# Patient Record
Sex: Male | Born: 1955 | Race: White | Hispanic: No | Marital: Married | Smoking: Former smoker
Health system: Southern US, Community
[De-identification: ages and names within clinical notes are randomized; demographics above are authoritative.]

## PROBLEM LIST (undated history)

## (undated) DIAGNOSIS — E7800 Pure hypercholesterolemia, unspecified: Secondary | ICD-10-CM

## (undated) DIAGNOSIS — I639 Cerebral infarction, unspecified: Secondary | ICD-10-CM

## (undated) DIAGNOSIS — I10 Essential (primary) hypertension: Secondary | ICD-10-CM

## (undated) DIAGNOSIS — M109 Gout, unspecified: Secondary | ICD-10-CM

## (undated) HISTORY — PX: CARDIAC CATHETERIZATION: SHX172

---

## 2013-11-10 ENCOUNTER — Emergency Department: Payer: Self-pay | Admitting: Emergency Medicine

## 2013-11-10 LAB — BASIC METABOLIC PANEL
ANION GAP: 5 — AB (ref 7–16)
BUN: 24 mg/dL — ABNORMAL HIGH (ref 7–18)
CALCIUM: 9.2 mg/dL (ref 8.5–10.1)
CO2: 30 mmol/L (ref 21–32)
Chloride: 107 mmol/L (ref 98–107)
Creatinine: 1.58 mg/dL — ABNORMAL HIGH (ref 0.60–1.30)
EGFR (African American): 56 — ABNORMAL LOW
GFR CALC NON AF AMER: 48 — AB
Glucose: 163 mg/dL — ABNORMAL HIGH (ref 65–99)
Osmolality: 291 (ref 275–301)
Potassium: 4.1 mmol/L (ref 3.5–5.1)
Sodium: 142 mmol/L (ref 136–145)

## 2013-11-10 LAB — CBC WITH DIFFERENTIAL/PLATELET
Basophil #: 0.1 10*3/uL (ref 0.0–0.1)
Basophil %: 0.8 %
EOS ABS: 0.1 10*3/uL (ref 0.0–0.7)
EOS PCT: 1.1 %
HCT: 43.8 % (ref 40.0–52.0)
HGB: 14.5 g/dL (ref 13.0–18.0)
Lymphocyte #: 1.7 10*3/uL (ref 1.0–3.6)
Lymphocyte %: 17.7 %
MCH: 28 pg (ref 26.0–34.0)
MCHC: 33.2 g/dL (ref 32.0–36.0)
MCV: 84 fL (ref 80–100)
Monocyte #: 0.4 x10 3/mm (ref 0.2–1.0)
Monocyte %: 3.9 %
Neutrophil #: 7.2 10*3/uL — ABNORMAL HIGH (ref 1.4–6.5)
Neutrophil %: 76.5 %
PLATELETS: 248 10*3/uL (ref 150–440)
RBC: 5.19 10*6/uL (ref 4.40–5.90)
RDW: 13.8 % (ref 11.5–14.5)
WBC: 9.4 10*3/uL (ref 3.8–10.6)

## 2013-11-10 LAB — TROPONIN I: Troponin-I: <0.02 ng/mL

## 2013-11-10 LAB — URINALYSIS, COMPLETE
BACTERIA: NONE SEEN
BLOOD: NEGATIVE
Bilirubin,UR: NEGATIVE
GLUCOSE, UR: NEGATIVE mg/dL (ref 0–75)
Ketone: NEGATIVE
Leukocyte Esterase: NEGATIVE
NITRITE: NEGATIVE
PH: 6 (ref 4.5–8.0)
Protein: 30
RBC,UR: <1 /HPF (ref 0–5)
SQUAMOUS EPITHELIAL: NONE SEEN
Specific Gravity: 1.018 (ref 1.003–1.030)
WBC UR: 2 /HPF (ref 0–5)

## 2013-11-10 LAB — CK TOTAL AND CKMB (NOT AT ARMC)
CK, TOTAL: 125 U/L
CK-MB: 1.5 ng/mL (ref 0.5–3.6)

## 2013-11-11 DIAGNOSIS — Z8673 Personal history of transient ischemic attack (TIA), and cerebral infarction without residual deficits: Secondary | ICD-10-CM | POA: Insufficient documentation

## 2013-12-01 DIAGNOSIS — I10 Essential (primary) hypertension: Secondary | ICD-10-CM | POA: Insufficient documentation

## 2013-12-01 DIAGNOSIS — E785 Hyperlipidemia, unspecified: Secondary | ICD-10-CM | POA: Insufficient documentation

## 2013-12-17 DIAGNOSIS — I776 Arteritis, unspecified: Secondary | ICD-10-CM | POA: Insufficient documentation

## 2014-11-07 NOTE — Consult Note (Signed)
PATIENT NAMGuadlupe Strong:  Strong, [name] MR#:  [id number] DATE OF BIRTH:  July 01, 1956  DATE OF CONSULTATION:  11/11/2013  CONSULTING PHYSICIAN:  Starleen Armsawood S. Lorenia Hoston, MD  REFERRING PHYSICIAN: Dr. Sharyn CreamerMark Quale.  PRIMARY CARE PHYSICIAN: None.   CHIEF COMPLAINT: Dizziness, weakness.   HISTORY OF PRESENT ILLNESS: This is a 59 year old male without significant past medical history, who presents initially with complaints of dizziness and generalized weakness. At the time of my exam, the patient was lethargic. Most of the history was obtained from ED staff and family at bedside. The patient was working out this evening, where he started to have some dizziness, nausea, generalized weakness. Around 7:00, when he presented to the ED, the patient had CT head without contrast which did show evidence of cerebellar abnormalities suspicious for lacunar infarcts, chronic on the right, possibly acute versus subacute in the left. The patient by 8:00 as most of his symptoms improved. At around 9:00, the patient had recurrence of his symptoms with severe dizziness, and generalized weakness as well. The patient had CTA chest angiogram to rule out aortic dissection, which did not show any evidence of dissection, but did show evidence of mural thrombus. By the time of my exam around 12:30, the patient was noticed to have a significant change in mental status, where he is lethargic, has significant right side weakness as well, and has closed eye in the right eye. As well, [facility] which I discussed with Dr. Harl Favoravit over the phone who thought suspicion might be having brain stem infarction. As he is currently out of his TPA window, requested possible need for transfer for tertiary care center for possible need for interventional neurosurgery. He recommended CT head and neck, as there is a suspicion for vertebral dissection or passing a clot. Contacted [facility] stroke center. Discussed with Dr. Lorenso CourierPowers, who  will accept the transfer at this point. The patient is getting CTA head and neck at this point. The results will be called to Dr. Lorenso CourierPowers as the patient [facility]  if he needed any intervention done, it could be done as soon as possible.   PAST MEDICAL HISTORY: None.   PAST SURGICAL HISTORY: None.   SOCIAL HISTORY: No history of smoking, alcohol, or illicit drug use.   FAMILY HISTORY: Family history of CVA in the patient's father but at an older age.   REVIEW OF SYSTEMS: The patient is currently lethargic. Cannot give any reliable review of systems, confused.   ALLERGIES: No known drug allergies.   HOME MEDICATIONS:  1. Fish oil.  2. Multivitamins.   PHYSICAL EXAMINATION:  VITAL SIGNS: Temperature 96.8, pulse 56, respiratory rate 16, blood pressure 147/66, satting 97% on room air.  GENERAL: The patient is lethargic, confused in bed.  HEENT: Head atraumatic, normocephalic. Pupils: Have bilateral pupils reactive to light and conjunctivae, anicteric sclerae. Moist oral mucosa.  NECK: Supple. No thyromegaly. No JVD.  CHEST: Good air entry bilaterally. No wheezing, rales, rhonchi.  CARDIOVASCULAR: S1, S2 heard. no rubs,murmurs,gallops.  ABDOMEN: Soft, nontender, nondistended. Bowel sounds present.  EXTREMITIES: No edema. No clubbing. No cyanosis.  PSYCHIATRIC: The patient is lethargic and confused, but when he is pushed to answer, he is awake. alert x 2.  NEUROLOGIC: Examination was very difficult to perform secondary to his altered mental status. Cranial nerve exam was significant for very mild right tongue deviation but patient was able to move tongue to both side without significant deficits.  Eye examination was significant for right eye being closed as well patient was not able to move his bilateral eyes to the right direction. Motor right upper extremity weakness 3/5. Right lower extremity weakness 2 to 3/5. Left upper and lower motor 5/5 motor strength and able to  finger-to-nose test on the left side was performed and was normal and the right side was not able to perform due to right-side weakness.  SKIN: Normal skin turgor. Warm and dry.   PERTINENT LABORATORY DATA: Glucose 163, BUN 24, creatinine 1.58, sodium 142, potassium 4.1, creatinine 1.58, CO2 of 30. Troponin less than 0.02. White blood cells 9.4, hemoglobin 14.5, hematocrit 43.8, platelets 248,000. Urinalysis negative for leukocyte esterase and nitrite. ABG showing pH of 7.38, pCO2 41, pO2 of 63.   IMAGING STUDIES: CT head without contrast showing vague small areas of decreased attenuation at the left cerebral hemisphere, may reflect small lacunar infarcts of indeterminate age. No evidence of hemorrhagic transformation and small lacunar infarct at the right cerebral hemispheres. CT angio chest showing no evidence of  aortic dissection, nor leaking aneurysm. There is a mural thrombus within the descending thoracic aorta.   CTA head and neck still pending.   ASSESSMENT AND PLAN: Cerebrovascular accident. Case was discussed with both neurology on call and [facility]  Dr. Lorenso Courier. The patient appears to be having strokes, suspicion as per Dr. Harl Favor is for possible vertebral dissection or basilar clot. This is why we are obtaining the CTA head and neck. The patient accepted to be transferred to [facility] stroke cancer. The patient was given per rectum aspirin. He is being appropriately hydrated with IV fluids. At this point the patient is out of TPA window as his symptoms have been more than 4.5 hours. If it is still possible, and depends on the CTA, he might need interventional neurosurgical intervention so this is why he is being transferred at [facility]. At this point they will resume care of the patient.   TOTAL TIME: Total time of care and consult and discussion with the consult spent on this case and discussing with the family, more than 80 minutes.    ____________________________ Starleen Arms, MD dse:lt/am D: 11/11/2013 03:07:07 ET T: 11/11/2013 04:54:43 ET JOB#: [id number]  cc: Starleen Arms, MD, <Dictator> Javien Tesch Teena Irani MD ELECTRONICALLY SIGNED 11/11/2013 23:46

## 2015-11-19 IMAGING — CT CT ANGIO CHEST
2 of 6 series · 18 of 36 positions shown · IV contrast (APPLIED)
Comparison: DG CHEST 1V PORT dated 11/10/2013

CLINICAL DATA: Near syncope, dizziness, nausea, and vomiting

EXAM:
CT ANGIOGRAPHY CHEST WITH CONTRAST
TECHNIQUE: Multidetector CT imaging of the chest was performed using the
standard protocol during bolus administration of intravenous
contrast. Multiplanar CT image reconstructions and MIPs were
obtained to evaluate the vascular anatomy.
CONTRAST:  100 cc of Isovue 370 intravenously

[Series 7: arterial · axial · arterial · 0.85mm/px · z∈[-356,-84]mm · 17 of 154 slices shown]
[im 9/154  lung]
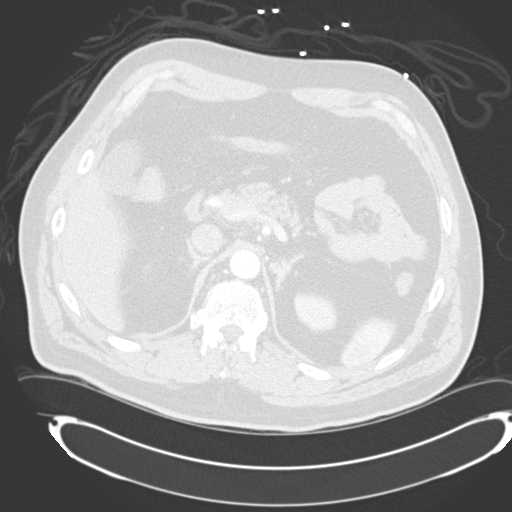
[im 17/154  mediastinal]
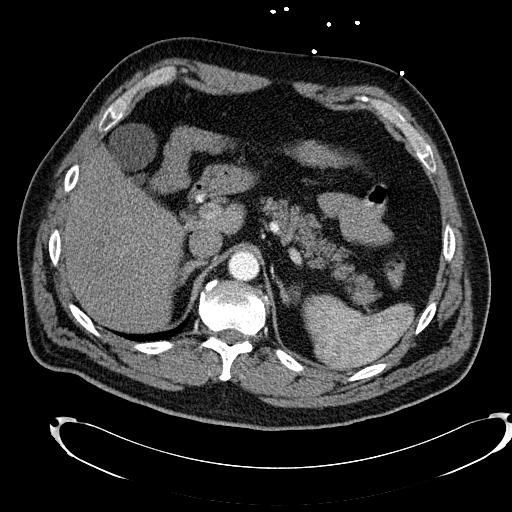
[im 25/154  lung]
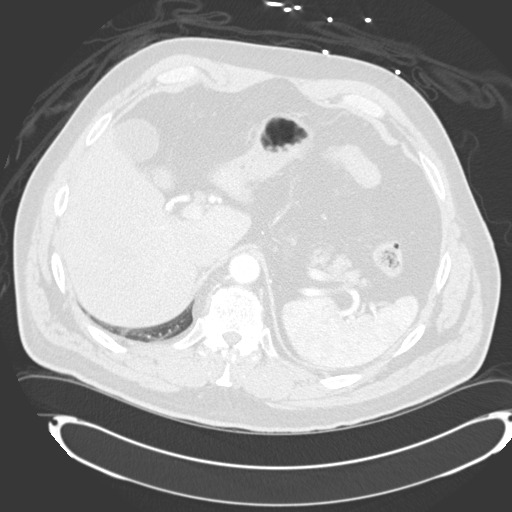
[im 33/154  mediastinal]
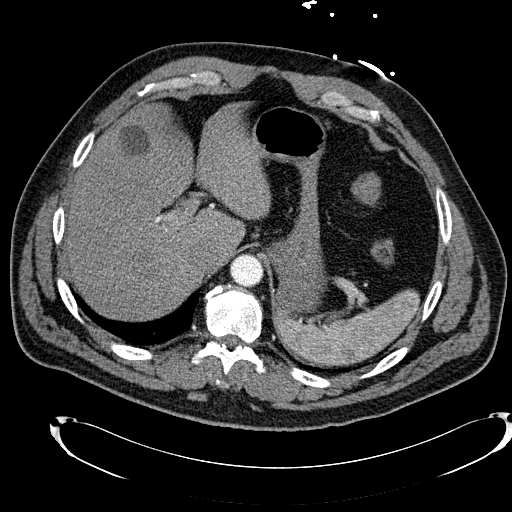
[im 41/154  lung]
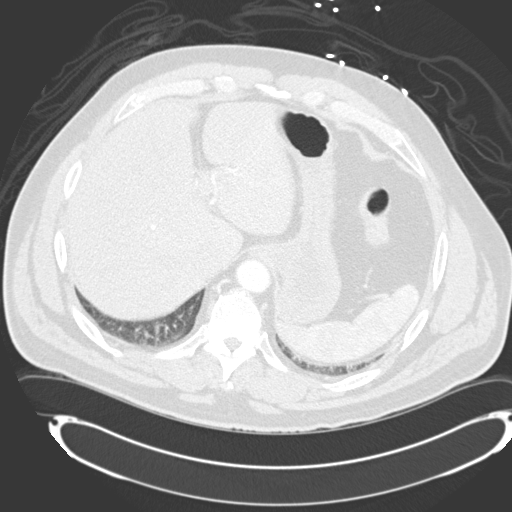
[im 49/154  mediastinal]
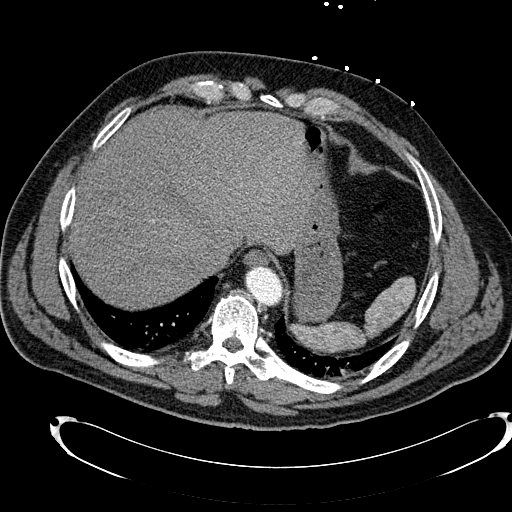
[im 57/154  lung]
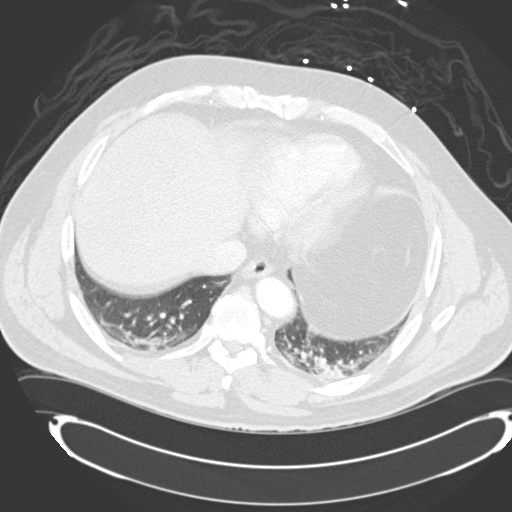
[im 65/154  mediastinal]
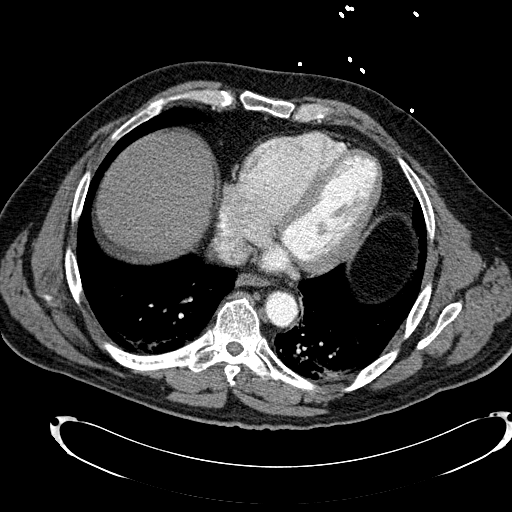
[im 81/154  lung]
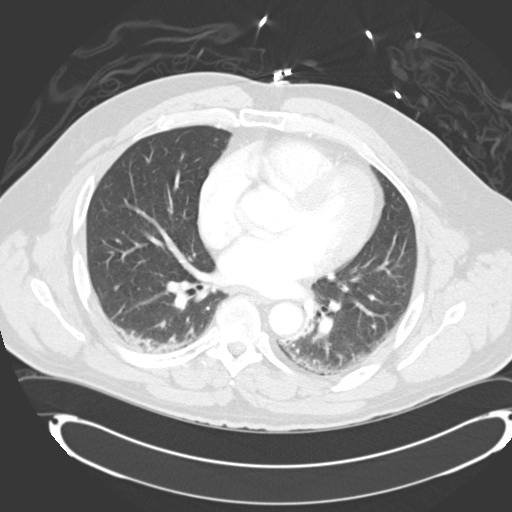
[im 89/154  mediastinal]
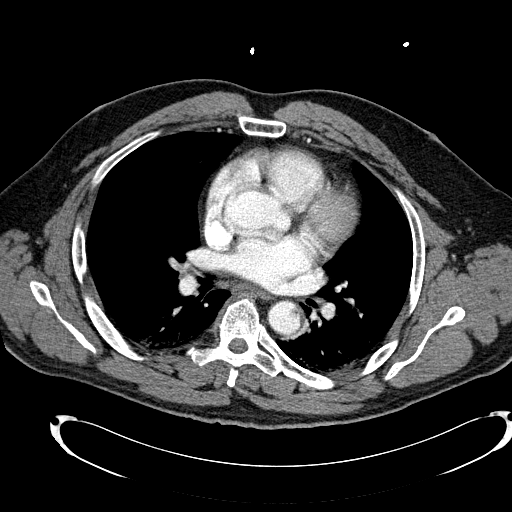
[im 97/154  lung]
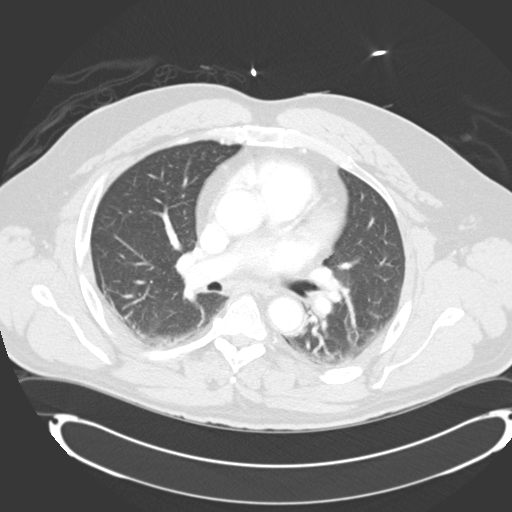
[im 105/154  mediastinal]
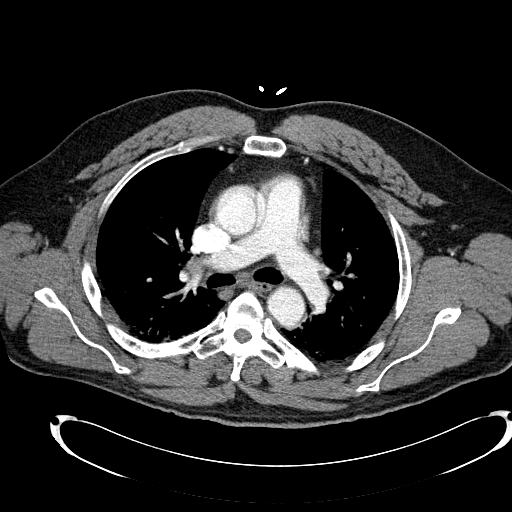
[im 113/154  lung]
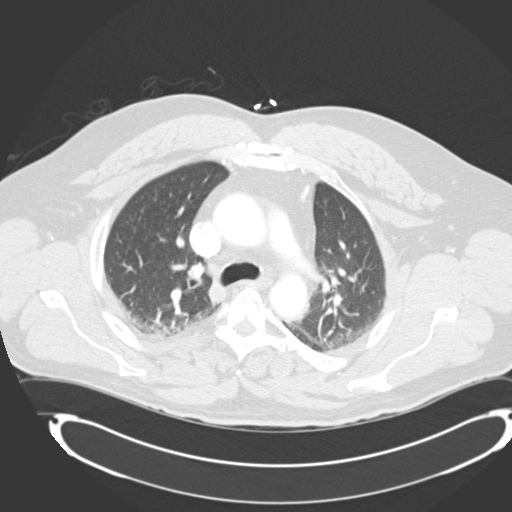
[im 121/154  mediastinal]
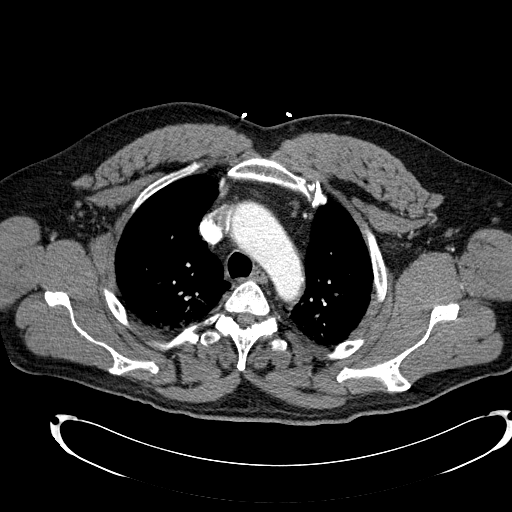
[im 129/154  lung]
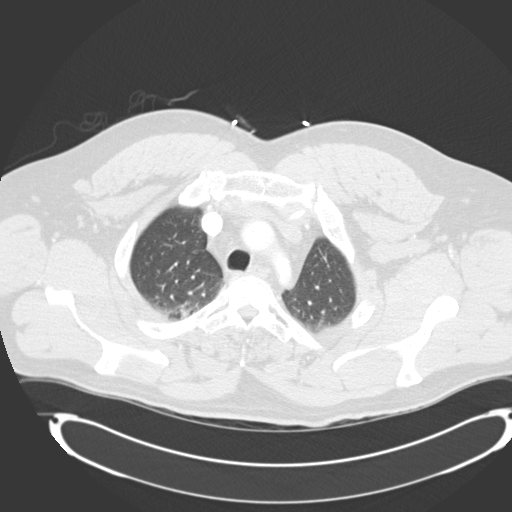
[im 137/154  mediastinal]
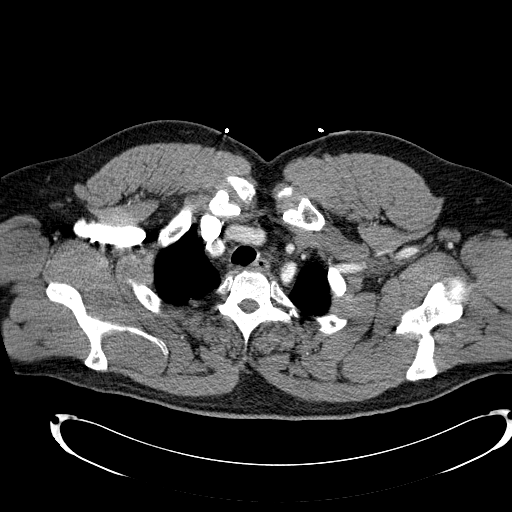
[im 145/154  lung]
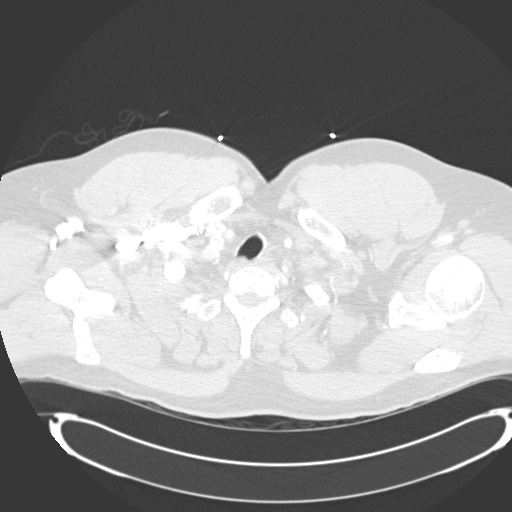

[Series 9: cor arterial mpr · coronal · arterial · 0.60mm/px · 1 of 148 slices shown]
[im 74/148  mediastinal]
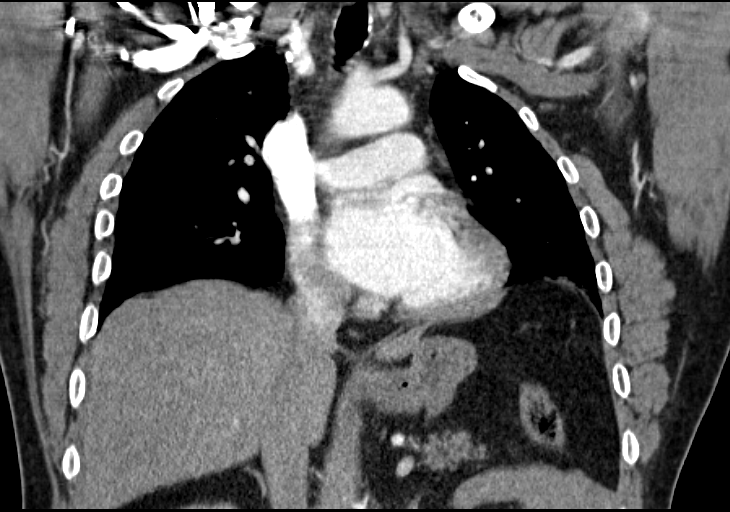

[18 of 36 positions shown; findings below may reference images not displayed]

FINDINGS: The caliber of the thoracic aorta is normal. There is no evidence of
a false lumen. A small amount of mural thrombus is present in the
descending thoracic aorta on images 63 through 69. Contrast within
the visualized portions of the pulmonary arterial tree appears
normal, but the study was not tailored for pulmonary arterial
evaluation. The retrosternal soft tissues exhibit no abnormal
densities. There is abundant retrosternal fat. The cardiac chambers
are top-normal in size. There is no mediastinal nor hilar
lymphadenopathy. There is no pleural nor pericardial effusion.

At lung window settings there is subsegmental atelectasis in the
dependent portion of both lungs. There is no alveolar infiltrate. No
abnormal pulmonary parenchymal nodules are demonstrated. There is no
pneumothorax or pneumomediastinum.

The observed portions of the bony thorax exhibit no acute
abnormalities.

Within the upper abdomen the observed portions of the liver and
spleen and adrenal glands are normal in appearance. The gallbladder
is adequately distended exhibits no calcified stones. The upper
abdominal aorta appears normal.

Review of the MIP images confirms the above findings.
IMPRESSION: 1. There is no evidence of thoracic aortic dissection nor leaking
aneurysm. There is mural thrombus within the descending thoracic
aorta.
2. There is bibasilar atelectasis in the dependent portion of both
lungs.
3. The study was not tailored for pulmonary arterial tree
evaluation. The observed portions of the pulmonary arterial tree
appear normal.

## 2015-11-19 IMAGING — CT CT HEAD WITHOUT CONTRAST
1 series · 15 of 30 positions shown, 19 images · non-contrast
Comparison: None.

REASON FOR EXAM: Headache at 8pm, now nausea, diaphoretic, feels weak.
headache improved
COMMENTS:

PROCEDURE:     CT  - CT HEAD WITHOUT CONTRAST  - November 10, 2013  [DATE]
CLINICAL DATA: Nausea, vomiting, weakness, diaphoresis and
headache.
EXAM:
CT HEAD WITHOUT CONTRAST
TECHNIQUE: Contiguous axial images were obtained from the base of the skull
through the vertex without intravenous contrast.

[Series 2: head wo · axial · 0.46mm/px · z∈[-192,-48]mm · 15 of 33 slices shown, 19 images]
[im 2/33  brain]
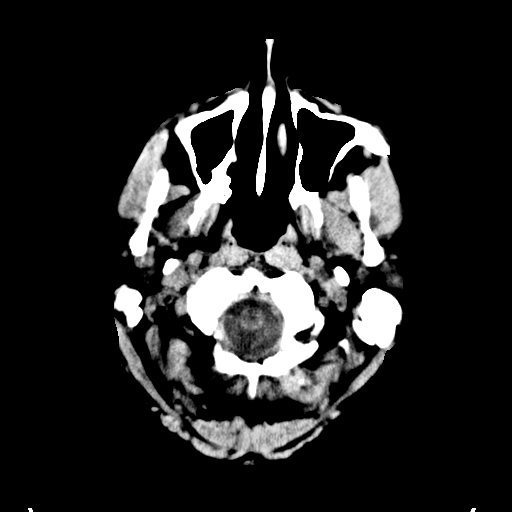
[im 2/33  bone]
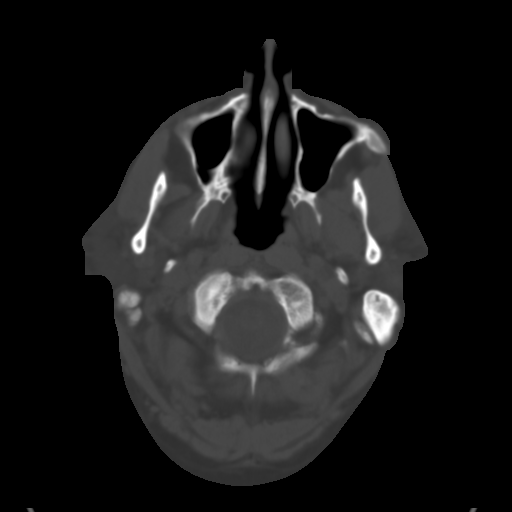
[im 4/33  brain]
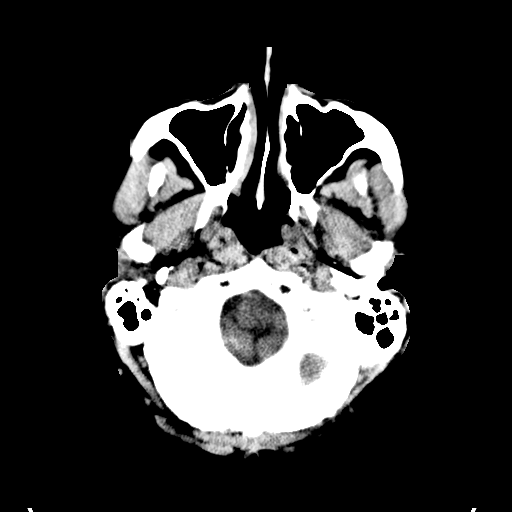
[im 6/33  brain]
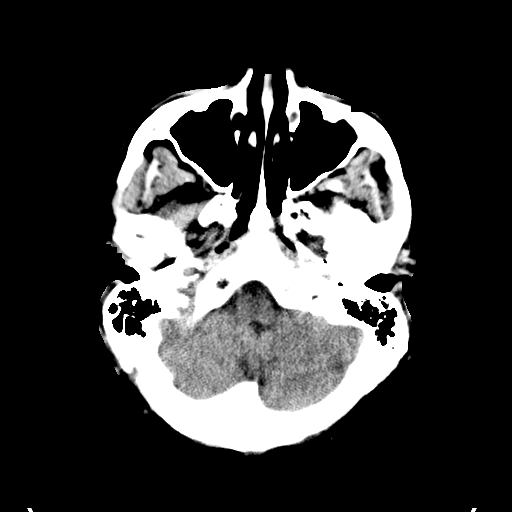
[im 8/33  brain]
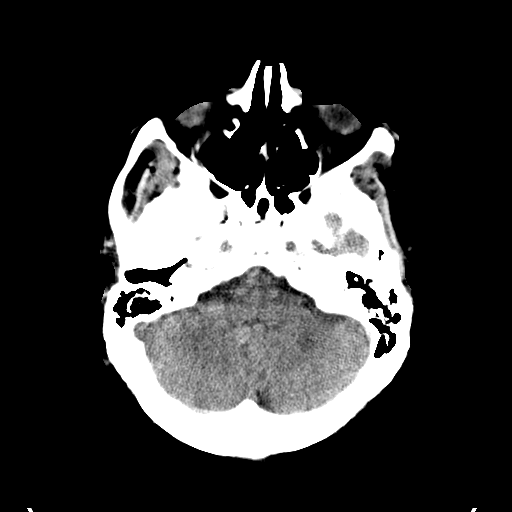
[im 10/33  brain]
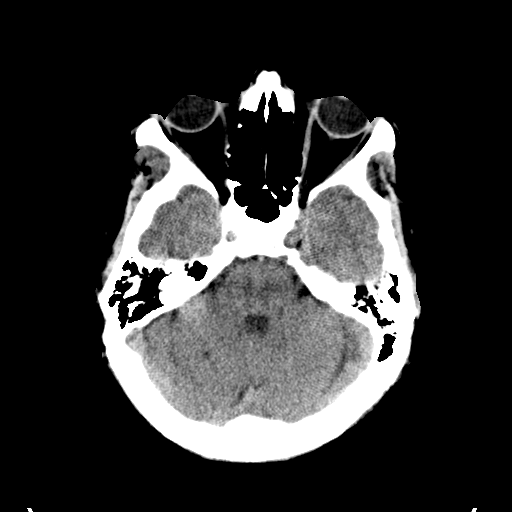
[im 10/33  bone]
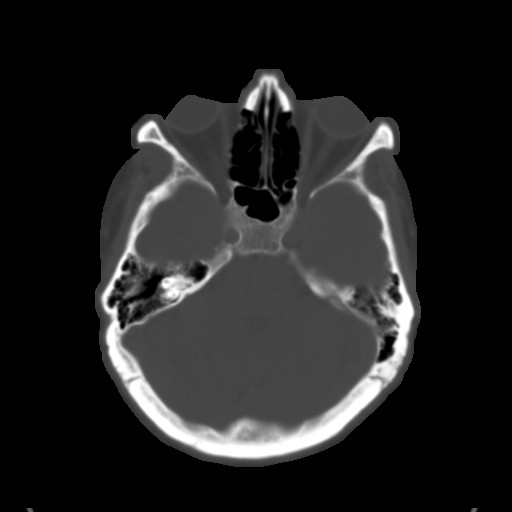
[im 13/33  brain]
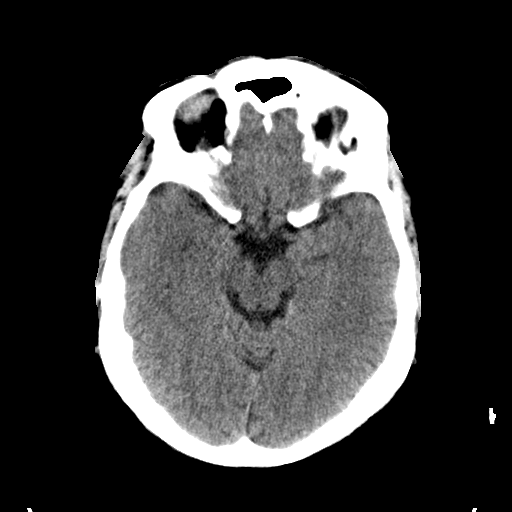
[im 15/33  brain]
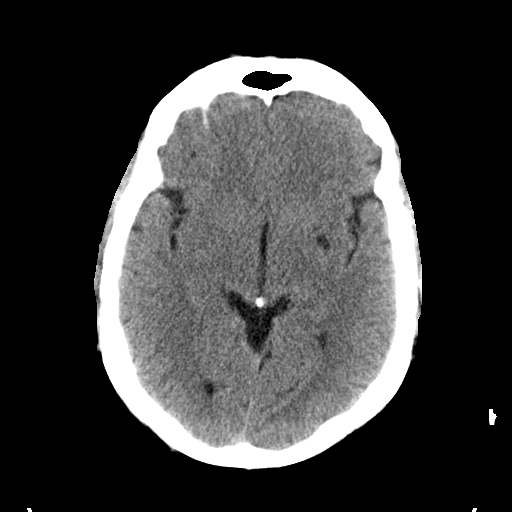
[im 17/33  brain]
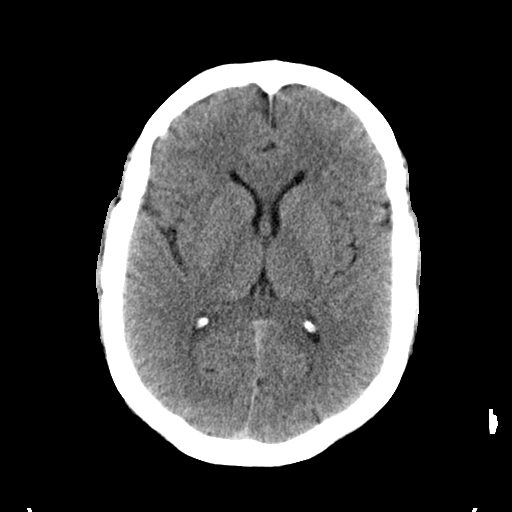
[im 18/33  brain]
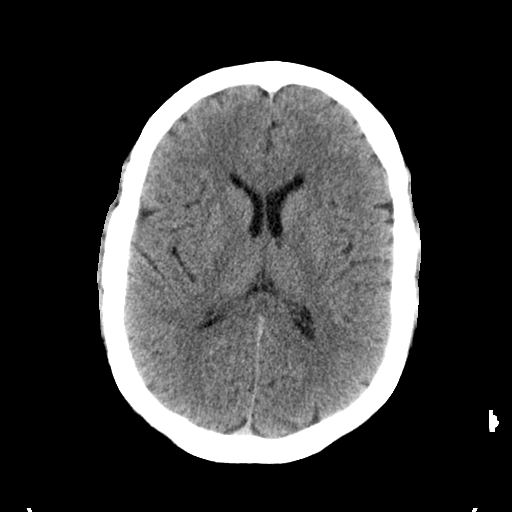
[im 18/33  bone]
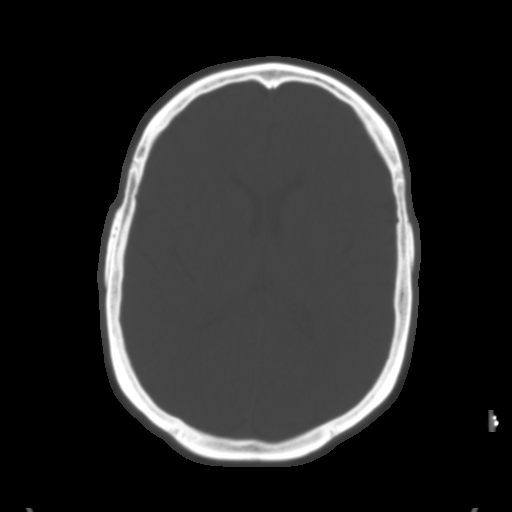
[im 20/33  brain]
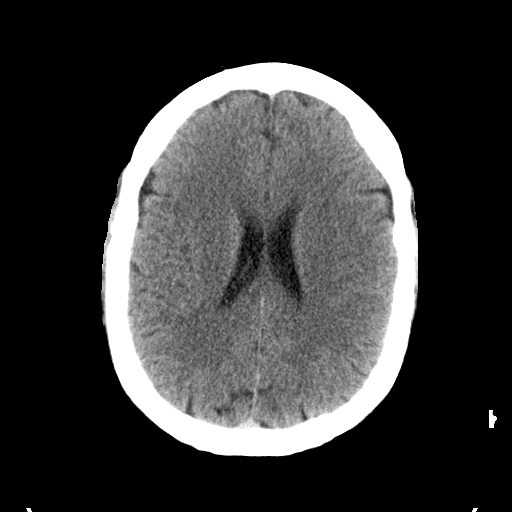
[im 23/33  brain]
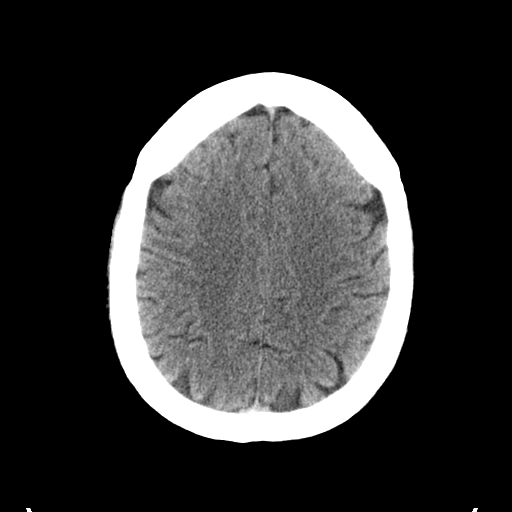
[im 25/33  brain]
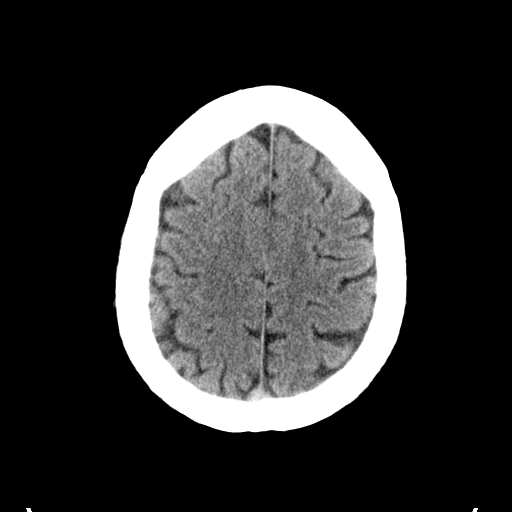
[im 27/33  brain]
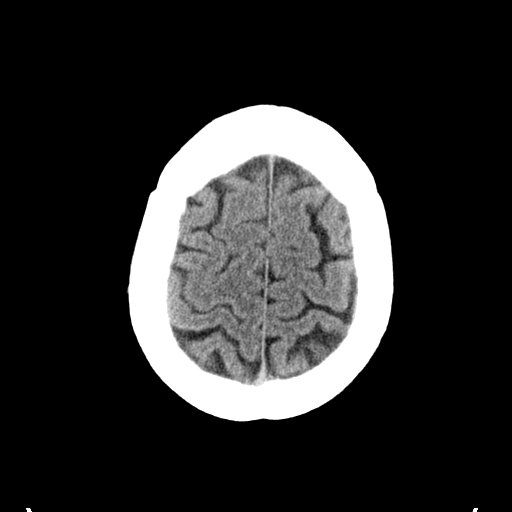
[im 27/33  bone]
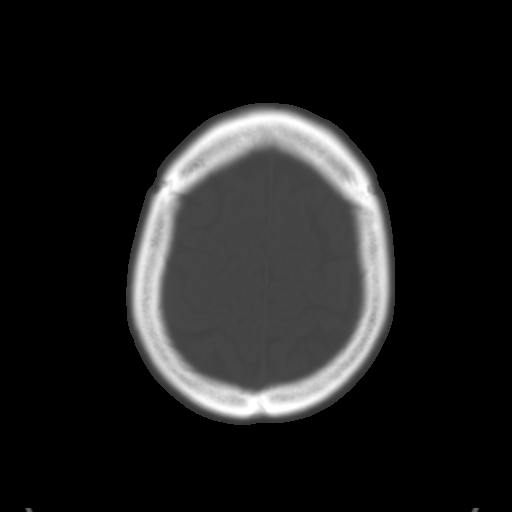
[im 29/33  brain]
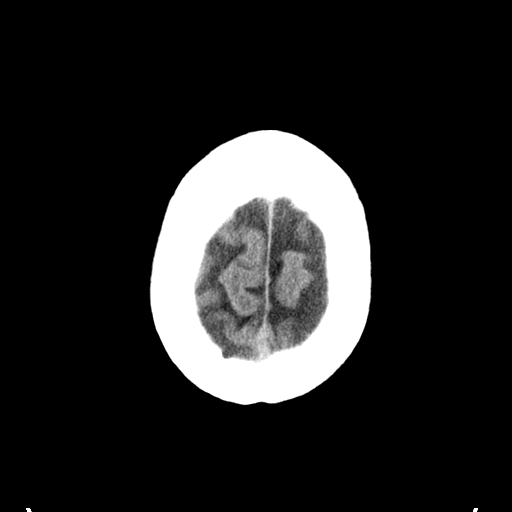
[im 31/33  brain]
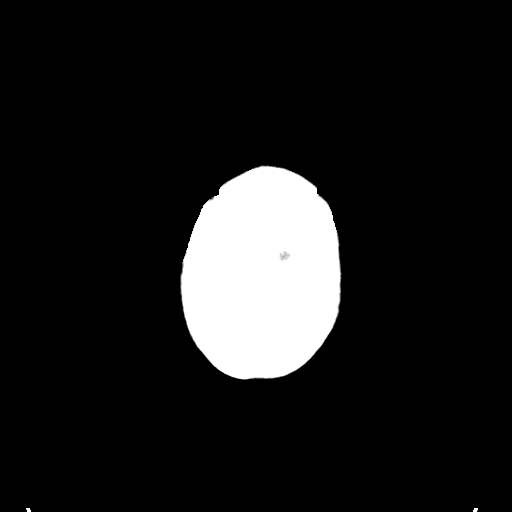

[15 of 30 positions shown; findings below may reference images not displayed]

FINDINGS: There is no evidence of acute infarction, mass lesion, or intra- or
extra-axial hemorrhage on CT.

Vague small areas of decreased attenuation at the left cerebellar
hemisphere may reflect small lacunar infarcts of indeterminate age.
A small chronic lacunar infarct is noted at the right cerebellar
hemisphere. A likely prominent Virchow-Robin space is noted at the
left inferior basal ganglia.

The brainstem and fourth ventricle are within normal limits. The
third and lateral ventricles are unremarkable in appearance. The
cerebral hemispheres are symmetric in appearance, with normal
gray-white differentiation. No mass effect or midline shift is seen.

There is no evidence of fracture; visualized osseous structures are
unremarkable in appearance. The orbits are within normal limits. The
paranasal sinuses and mastoid air cells are well-aerated. No
significant soft tissue abnormalities are seen.
IMPRESSION: 1. Vague small areas of decreased attenuation at the left cerebellar
hemisphere may reflect small lacunar infarcts of indeterminate age.
No evidence of hemorrhagic transformation.
2. Small chronic lacunar infarct at the right cerebellar hemisphere.

## 2016-04-20 ENCOUNTER — Encounter: Payer: Self-pay | Admitting: *Deleted

## 2016-04-21 ENCOUNTER — Encounter
Admission: RE | Disposition: A | Discharge status: Home / Self Care | Payer: Self-pay | Source: Ambulatory Visit | Attending: Unknown Physician Specialty

## 2016-04-21 ENCOUNTER — Ambulatory Visit: Payer: Managed Care, Other (non HMO) | Admitting: Anesthesiology

## 2016-04-21 ENCOUNTER — Encounter: Payer: Self-pay | Admitting: *Deleted

## 2016-04-21 ENCOUNTER — Ambulatory Visit
Admission: RE | Admit: 2016-04-21 | Discharge: 2016-04-21 | Disposition: A | Discharge status: Home / Self Care | Payer: Managed Care, Other (non HMO) | Source: Ambulatory Visit | Attending: Unknown Physician Specialty | Admitting: Unknown Physician Specialty

## 2016-04-21 DIAGNOSIS — Z1211 Encounter for screening for malignant neoplasm of colon: Secondary | ICD-10-CM | POA: Insufficient documentation

## 2016-04-21 DIAGNOSIS — I10 Essential (primary) hypertension: Secondary | ICD-10-CM | POA: Diagnosis not present

## 2016-04-21 DIAGNOSIS — E7800 Pure hypercholesterolemia, unspecified: Secondary | ICD-10-CM | POA: Insufficient documentation

## 2016-04-21 DIAGNOSIS — Z7982 Long term (current) use of aspirin: Secondary | ICD-10-CM | POA: Diagnosis not present

## 2016-04-21 DIAGNOSIS — Z8673 Personal history of transient ischemic attack (TIA), and cerebral infarction without residual deficits: Secondary | ICD-10-CM | POA: Diagnosis not present

## 2016-04-21 DIAGNOSIS — K640 First degree hemorrhoids: Secondary | ICD-10-CM | POA: Insufficient documentation

## 2016-04-21 DIAGNOSIS — M109 Gout, unspecified: Secondary | ICD-10-CM | POA: Diagnosis not present

## 2016-04-21 DIAGNOSIS — Z79899 Other long term (current) drug therapy: Secondary | ICD-10-CM | POA: Diagnosis not present

## 2016-04-21 DIAGNOSIS — Z87891 Personal history of nicotine dependence: Secondary | ICD-10-CM | POA: Insufficient documentation

## 2016-04-21 HISTORY — DX: Pure hypercholesterolemia, unspecified: E78.00

## 2016-04-21 HISTORY — DX: Gout, unspecified: M10.9

## 2016-04-21 HISTORY — DX: Essential (primary) hypertension: I10

## 2016-04-21 HISTORY — PX: COLONOSCOPY WITH PROPOFOL: SHX5780

## 2016-04-21 HISTORY — DX: Cerebral infarction, unspecified: I63.9

## 2016-04-21 SURGERY — COLONOSCOPY WITH PROPOFOL
Anesthesia: General

## 2016-04-21 MED ORDER — PHENYLEPHRINE HCL 10 MG/ML IJ SOLN
INTRAMUSCULAR | Status: DC | PRN
  Administered 2016-04-21 (×2): 100 ug via INTRAVENOUS

## 2016-04-21 MED ORDER — PROPOFOL 500 MG/50ML IV EMUL
INTRAVENOUS | Status: DC | PRN
  Administered 2016-04-21: 140 ug/kg/min via INTRAVENOUS

## 2016-04-21 MED ORDER — LIDOCAINE HCL (CARDIAC) 20 MG/ML IV SOLN
INTRAVENOUS | Status: DC | PRN
  Administered 2016-04-21: 60 mg via INTRAVENOUS

## 2016-04-21 MED ORDER — SODIUM CHLORIDE 0.9 % IV SOLN: INTRAVENOUS | Status: DC

## 2016-04-21 MED ORDER — SODIUM CHLORIDE 0.9 % IV SOLN
INTRAVENOUS | Status: DC
  Administered 2016-04-21: 15:00:00 via INTRAVENOUS

## 2016-04-21 MED ORDER — MIDAZOLAM HCL 2 MG/2ML IJ SOLN
INTRAMUSCULAR | Status: DC | PRN
  Administered 2016-04-21: 1 mg via INTRAVENOUS

## 2016-04-21 NOTE — Anesthesia Preprocedure Evaluation (Addendum)
Anesthesia Evaluation  Patient identified by MRN, date of birth, ID band Patient awake    Reviewed: Allergy & Precautions, NPO status , Patient's Chart, lab work & pertinent test results  Airway Mallampati: II       Dental  (+) Caps   Pulmonary former smoker,    Pulmonary exam normal        Cardiovascular hypertension, Pt. on medications Normal cardiovascular exam     Neuro/Psych CVA, No Residual Symptoms negative psych ROS   GI/Hepatic negative GI ROS, Neg liver ROS,   Endo/Other  negative endocrine ROS  Renal/GU negative Renal ROS  negative genitourinary   Musculoskeletal negative musculoskeletal ROS (+)   Abdominal Normal abdominal exam  (+)   Peds negative pediatric ROS (+)  Hematology negative hematology ROS (+)   Anesthesia Other Findings Gout HTN stroke  Reproductive/Obstetrics                             Anesthesia Physical Anesthesia Plan  ASA: III  Anesthesia Plan: General   Post-op Pain Management:    Induction: Intravenous  Airway Management Planned: Nasal Cannula  Additional Equipment:   Intra-op Plan:   Post-operative Plan:   Informed Consent: I have reviewed the patients History and Physical, chart, labs and discussed the procedure including the risks, benefits and alternatives for the proposed anesthesia with the patient or authorized representative who has indicated his/her understanding and acceptance.   Dental advisory given  Plan Discussed with: CRNA and Surgeon  Anesthesia Plan Comments:         Anesthesia Quick Evaluation

## 2016-04-21 NOTE — H&P (Signed)
   Primary Care Physician:  Sharilyn SitesMark Heffington, MD Primary Gastroenterologist:  Dr. Mechele CollinElliott  Pre-Procedure History & Physical: HPI:  Gregory Moscoso Montez HagemanJr. is a 60 y.o. male is here for an colonoscopy.   Past Medical History:  Diagnosis Date  . Gout   . High cholesterol   . Hypertension   . Stroke [facility](HCC)     Past Surgical History:  Procedure Laterality Date  . CARDIAC CATHETERIZATION      Prior to Admission medications   Medication Sig Start Date End Date Taking? Authorizing Provider  aspirin EC 325 MG tablet Take 325 mg by mouth daily.   Yes Historical Provider, MD  atorvastatin (LIPITOR) 80 MG tablet Take 80 mg by mouth daily.   Yes Historical Provider, MD  ibuprofen (ADVIL,MOTRIN) 200 MG tablet Take 200 mg by mouth every 6 (six) hours as needed.   Yes Historical Provider, MD  lisinopril (PRINIVIL,ZESTRIL) 20 MG tablet Take 20 mg by mouth daily.   Yes Historical Provider, MD    Allergies as of 01/26/2016  . (Not on File)    History reviewed. No pertinent family history.  Social History   Social History  . Marital status: Married    Spouse name: N/A  . Number of children: N/A  . Years of education: N/A   Occupational History  . Not on file.   Social History Main Topics  . Smoking status: Former Games developermoker  . Smokeless tobacco: Never Used  . Alcohol use No  . Drug use: No  . Sexual activity: Not on file   Other Topics Concern  . Not on file   Social History Narrative  . No narrative on file    Review of Systems: See HPI, otherwise negative ROS  Physical Exam: BP (!) 145/91   Pulse 93   Temp 97.7 F (36.5 C) (Tympanic)   Resp 16   Ht 6\' 3"  (1.905 m)   Wt 131.5 kg (290 lb)   SpO2 98%   BMI 36.25 kg/m  General:   Alert,  pleasant and cooperative in NAD Head:  Normocephalic and atraumatic. Neck:  Supple; no masses or thyromegaly. Lungs:  Clear throughout to auscultation.    Heart:  Regular rate and rhythm. Abdomen:  Soft, nontender and nondistended. Normal  bowel sounds, without guarding, and without rebound.   Neurologic:  Alert and  oriented x4;  grossly normal neurologically.  Impression/Plan: Gregory BellSouthLong Jr. is here for an colonoscopy to be performed for screening  Risks, benefits, limitations, and alternatives regarding  colonoscopy have been reviewed with the patient.  Questions have been answered.  All parties agreeable.   Lynnae PrudeELLIOTT, Gregory Steier, MD  04/21/2016, 3:30 PM

## 2016-04-21 NOTE — Op Note (Signed)
[facility] Gastroenterology Patient Name: [name] Procedure Date: 04/21/2016 3:31 PM MRN: [id number] Account #: [id number] Date of Birth: [birth year] Admit Type: Outpatient Age: 60 Room: [facility] ENDO ROOM 4 Gender: Male Note Status: Finalized Procedure:            Colonoscopy Indications:          Screening for colorectal malignant neoplasm Providers:            Scot Jun, MD Referring MD:         Sharilyn Sites, MD Medicines:            Propofol per Anesthesia Complications:        No immediate complications. Procedure:            Pre-Anesthesia Assessment:                       - After reviewing the risks and benefits, the patient                        was deemed in satisfactory condition to undergo the                        procedure.                       After obtaining informed consent, the colonoscope was                        passed under direct vision. Throughout the procedure,                        the patient's blood pressure, pulse, and oxygen                        saturations were monitored continuously. The                        Colonoscope was introduced through the anus and                        advanced to the the cecum, identified by appendiceal                        orifice and ileocecal valve. The colonoscopy was                        performed without difficulty. The patient tolerated the                        procedure well. The quality of the bowel preparation                        was excellent. Findings:      Internal hemorrhoids were found during endoscopy. The hemorrhoids were       small and Grade I (internal hemorrhoids that do not prolapse).      The exam was otherwise without abnormality. Impression:           - Internal hemorrhoids.                       - The examination was otherwise normal.                       -  No specimens collected. Recommendation:       - Repeat colonoscopy in 10 years for screening  purposes. Scot Junobert T Kavish Lafitte, MD 04/21/2016 3:54:23 PM This report has been signed electronically. Number of Addenda: 0 Note Initiated On: 04/21/2016 3:31 PM Scope Withdrawal Time: 0 hours 9 minutes 51 seconds  Total Procedure Duration: 0 hours 15 minutes 29 seconds       [facility]

## 2016-04-21 NOTE — Transfer of Care (Signed)
Immediate Anesthesia Transfer of Care Note  Patient: [name].  Procedure(s) Performed: Procedure(s): COLONOSCOPY WITH PROPOFOL (N/A)  Patient Location: Endoscopy Unit  Anesthesia Type:General  Level of Consciousness: awake, alert , oriented and patient cooperative  Airway & Oxygen Therapy: Patient Spontanous Breathing and Patient connected to nasal cannula oxygen  Post-op Assessment: Report given to RN, Post -op Vital signs reviewed and stable and Patient moving all extremities X 4  Post vital signs: Reviewed and stable  Last Vitals:  Vitals:   04/21/16 1354 04/21/16 1559  BP: (!) 145/91 109/65  Pulse: 93 75  Resp: 16 17  Temp: 36.5 C 36.4 C    Last Pain:  Vitals:   04/21/16 1559  TempSrc: Tympanic         Complications: No apparent anesthesia complications

## 2016-04-21 NOTE — Anesthesia Postprocedure Evaluation (Signed)
Anesthesia Post Note  Patient: Kaylen Menges Montez HagemanJr.  Procedure(s) Performed: Procedure(s) (LRB): COLONOSCOPY WITH PROPOFOL (N/A)  Patient location during evaluation: Endoscopy Anesthesia Type: General Level of consciousness: awake and alert Pain management: pain level controlled Vital Signs Assessment: post-procedure vital signs reviewed and stable Respiratory status: spontaneous breathing, nonlabored ventilation, respiratory function stable and patient connected to nasal cannula oxygen Cardiovascular status: blood pressure returned to baseline and stable Postop Assessment: no signs of nausea or vomiting Anesthetic complications: no    Last Vitals:  Vitals:   04/21/16 1354 04/21/16 1559  BP: (!) 145/91 109/65  Pulse: 93 75  Resp: 16 17  Temp: 36.5 C 36.4 C    Last Pain:  Vitals:   04/21/16 1559  TempSrc: Tympanic                 Cleda MccreedyJoseph K Justeen Hehr

## 2016-04-23 NOTE — Progress Notes (Signed)
Nonidentifying voicemail.  No message lefet.

## 2016-04-24 ENCOUNTER — Encounter: Payer: Self-pay | Admitting: Unknown Physician Specialty

## 2016-12-14 DIAGNOSIS — N182 Chronic kidney disease, stage 2 (mild): Secondary | ICD-10-CM | POA: Insufficient documentation

## 2019-08-08 ENCOUNTER — Ambulatory Visit: Payer: Managed Care, Other (non HMO) | Attending: Internal Medicine

## 2019-08-08 DIAGNOSIS — Z20822 Contact with and (suspected) exposure to covid-19: Secondary | ICD-10-CM

## 2019-08-09 LAB — NOVEL CORONAVIRUS, NAA: SARS-CoV-2, NAA: NOT DETECTED

## 2019-08-26 DIAGNOSIS — E669 Obesity, unspecified: Secondary | ICD-10-CM | POA: Insufficient documentation

## 2022-07-18 DIAGNOSIS — N281 Cyst of kidney, acquired: Secondary | ICD-10-CM | POA: Insufficient documentation

## 2023-02-01 DIAGNOSIS — M109 Gout, unspecified: Secondary | ICD-10-CM | POA: Insufficient documentation

## 2024-02-07 ENCOUNTER — Encounter: Payer: Self-pay | Admitting: Dermatology

## 2024-02-07 ENCOUNTER — Ambulatory Visit: Admitting: Dermatology

## 2024-02-07 DIAGNOSIS — L821 Other seborrheic keratosis: Secondary | ICD-10-CM | POA: Diagnosis not present

## 2024-02-07 DIAGNOSIS — I788 Other diseases of capillaries: Secondary | ICD-10-CM

## 2024-02-07 DIAGNOSIS — L814 Other melanin hyperpigmentation: Secondary | ICD-10-CM | POA: Diagnosis not present

## 2024-02-07 DIAGNOSIS — D492 Neoplasm of unspecified behavior of bone, soft tissue, and skin: Secondary | ICD-10-CM

## 2024-02-07 DIAGNOSIS — L738 Other specified follicular disorders: Secondary | ICD-10-CM

## 2024-02-07 DIAGNOSIS — L578 Other skin changes due to chronic exposure to nonionizing radiation: Secondary | ICD-10-CM

## 2024-02-07 DIAGNOSIS — C44310 Basal cell carcinoma of skin of unspecified parts of face: Secondary | ICD-10-CM

## 2024-02-07 DIAGNOSIS — W908XXA Exposure to other nonionizing radiation, initial encounter: Secondary | ICD-10-CM

## 2024-02-07 DIAGNOSIS — L817 Pigmented purpuric dermatosis: Secondary | ICD-10-CM

## 2024-02-07 DIAGNOSIS — D229 Melanocytic nevi, unspecified: Secondary | ICD-10-CM

## 2024-02-07 DIAGNOSIS — D1801 Hemangioma of skin and subcutaneous tissue: Secondary | ICD-10-CM

## 2024-02-07 DIAGNOSIS — C4491 Basal cell carcinoma of skin, unspecified: Secondary | ICD-10-CM

## 2024-02-07 DIAGNOSIS — Z1283 Encounter for screening for malignant neoplasm of skin: Secondary | ICD-10-CM

## 2024-02-07 HISTORY — DX: Basal cell carcinoma of skin, unspecified: C44.91

## 2024-02-07 NOTE — Progress Notes (Signed)
 New Patient Visit   Subjective  [name]. is a 68 y.o. male who presents for the following: Skin Cancer Screening and Full Body Skin Exam check spots, chest x 26yrs, no symptoms, R leg below knee 36yrs, started as a flaky spot and has scratched at and now darker, check spots on back wife noticed, no symptoms, no hx of skin cancer, no fhx of skin cancer  New patient referral from Gregory Grow, FNP.  Patient accompanied by wife who contributes to history.  The patient presents for Total-Body Skin Exam (TBSE) for skin cancer screening and mole check. The patient has spots, moles and lesions to be evaluated, some may be new or changing and the patient may have concern these could be cancer.   The following portions of the chart were reviewed this encounter and updated as appropriate: medications, allergies, medical history  Review of Systems:  No other skin or systemic complaints except as noted in HPI or Assessment and Plan.  Objective  Well appearing patient in no apparent distress; mood and affect are within normal limits.  A full examination was performed including scalp, head, eyes, ears, nose, lips, neck, chest, axillae, abdomen, back, buttocks, bilateral upper extremities, bilateral lower extremities, hands, feet, fingers, toes, fingernails, and toenails. All findings within normal limits unless otherwise noted below.   Relevant physical exam findings are noted in the Assessment and Plan.  R zygoma 5.56mm skin colored pap with telangiectasias   Assessment & Plan   SKIN CANCER SCREENING PERFORMED TODAY.  ACTINIC DAMAGE - Chronic condition, secondary to cumulative UV/sun exposure - diffuse scaly erythematous macules with underlying dyspigmentation - Recommend daily broad spectrum sunscreen SPF 30+ to sun-exposed areas, reapply every 2 hours as needed.  - Staying in the shade or wearing Vanevery sleeves, sun glasses (UVA+UVB protection) and wide brim hats (4-inch brim around  the entire circumference of the hat) are also recommended for sun protection.  - Call for new or changing lesions.  LENTIGINES, SEBORRHEIC KERATOSES, HEMANGIOMAS - Benign normal skin lesions - Benign-appearing - Call for any changes - SK below R knee, chest, back  MELANOCYTIC NEVI - Tan-brown and/or pink-flesh-colored symmetric macules and papules - Benign appearing on exam today - Observation - Call clinic for new or changing moles - Recommend daily use of broad spectrum spf 30+ sunscreen to sun-exposed areas.   Sebaceous Hyperplasia R medial upper eyelid - Small yellow papules with a central dell - Benign-appearing - Observe. Call for changes.   CAPILLARITIS Lower legs Exam: innumerable red to golden brown macules lower legs  Treatment Plan: Benign-appearing.  Observation.  Call clinic for new or changing lesions.  Recommend daily use of broad spectrum spf 30+ sunscreen to sun-exposed areas.  Recommend Compression socks qd    NEOPLASM OF SKIN R zygoma Skin / nail biopsy Type of biopsy: tangential   Informed consent: discussed and consent obtained   Timeout: patient name, date of birth, surgical site, and procedure verified   Procedure prep:  Patient was prepped and draped in usual sterile fashion Prep type:  Isopropyl alcohol Anesthesia: the lesion was anesthetized in a standard fashion   Anesthetic:  1% lidocaine  w/ epinephrine 1-100,000 buffered w/ 8.4% NaHCO3 Instrument used: DermaBlade   Hemostasis achieved with: pressure and aluminum chloride   Outcome: patient tolerated procedure well   Post-procedure details: sterile dressing applied and wound care instructions given   Dressing type: bandage and bacitracin    Specimen 1 - Surgical pathology Differential Diagnosis: BCC  vs Sebaceous Hyperplasia   Check Margins: No 5.32mm skin colored pap with telangiectasias  MULTIPLE BENIGN NEVI   ACTINIC ELASTOSIS   SEBORRHEIC KERATOSES   LENTIGINES   SEBACEOUS  HYPERPLASIA   CHERRY ANGIOMA   CAPILLARITIS    Return in about 1 year (around 02/06/2025) for TBSE.  I, [name], RMA, am acting as scribe for [name], MD .   Documentation: I have reviewed the above documentation for accuracy and completeness, and I agree with the above.  [name], MD

## 2024-02-07 NOTE — Patient Instructions (Addendum)
 Wound Care Instructions  Cleanse wound gently with soap and water once a day then pat dry with clean gauze. Apply a thin coat of Petrolatum (petroleum jelly, Vaseline) over the wound (unless you have an allergy to this). We recommend that you use a new, sterile tube of Vaseline. Do not pick or remove scabs. Do not remove the yellow or white healing tissue from the base of the wound.  Cover the wound with fresh, clean, nonstick gauze and secure with paper tape. You may use Band-Aids in place of gauze and tape if the wound is small enough, but would recommend trimming much of the tape off as there is often too much. Sometimes Band-Aids can irritate the skin.  You should call the office for your biopsy report after 1 week if you have not already been contacted.  If you experience any problems, such as abnormal amounts of bleeding, swelling, significant bruising, significant pain, or evidence of infection, please call the office immediately.  FOR ADULT SURGERY PATIENTS: If you need something for pain relief you may take 1 extra strength Tylenol (acetaminophen) AND 2 Ibuprofen (200mg  each) together every 4 hours as needed for pain. (do not take these if you are allergic to them or if you have a reason you should not take them.) Typically, you may only need pain medication for 1 to 3 days.    Seborrheic Keratosis  What causes seborrheic keratoses? Seborrheic keratoses are harmless, common skin growths that first appear during adult life.  As time goes by, more growths appear.  Some people may develop a large number of them.  Seborrheic keratoses appear on both covered and uncovered body parts.  They are not caused by sunlight.  The tendency to develop seborrheic keratoses can be inherited.  They vary in color from skin-colored to gray, brown, or even black.  They can be either smooth or have a rough, warty surface.   Seborrheic keratoses are superficial and look as if they were stuck on the skin.   Under the microscope this type of keratosis looks like layers upon layers of skin.  That is why at times the top layer may seem to fall off, but the rest of the growth remains and re-grows.    Treatment Seborrheic keratoses do not need to be treated, but can easily be removed in the office.  Seborrheic keratoses often cause symptoms when they rub on clothing or jewelry.  Lesions can be in the way of shaving.  If they become inflamed, they can cause itching, soreness, or burning.  Removal of a seborrheic keratosis can be accomplished by freezing, burning, or surgery. If any spot bleeds, scabs, or grows rapidly, please return to have it checked, as these can be an indication of a skin cancer.  Recommend daily broad spectrum sunscreen SPF 30+ to sun-exposed areas, reapply every 2 hours as needed. Call for new or changing lesions.  Staying in the shade or wearing Dolder sleeves, sun glasses (UVA+UVB protection) and wide brim hats (4-inch brim around the entire circumference of the hat) are also recommended for sun protection.     Due to recent changes in healthcare laws, you may see results of your pathology and/or laboratory studies on MyChart before the doctors have had a chance to review them. We understand that in some cases there may be results that are confusing or concerning to you. Please understand that not all results are received at the same time and often the doctors may need to interpret  multiple results in order to provide you with the best plan of care or course of treatment. Therefore, we ask that you please give us  2 business days to thoroughly review all your results before contacting the office for clarification. Should we see a critical lab result, you will be contacted sooner.   If You Need Anything After Your Visit  If you have any questions or concerns for your doctor, please call our main line at [phone] and press option 4 to reach your doctor's medical assistant. If no one  answers, please leave a voicemail as directed and we will return your call as soon as possible. Messages left after 4 pm will be answered the following business day.   You may also send us  a message via MyChart. We typically respond to MyChart messages within 1-2 business days.  For prescription refills, please ask your pharmacy to contact our office. Our fax number is [phone].  If you have an urgent issue when the clinic is closed that cannot wait until the next business day, you can page your doctor at the number below.    Please note that while we do our best to be available for urgent issues outside of office hours, we are not available 24/7.   If you have an urgent issue and are unable to reach us , you may choose to seek medical care at your doctor's office, retail clinic, urgent care center, or emergency room.  If you have a medical emergency, please immediately call 911 or go to the emergency department.  Pager Numbers  - Dr. Hester: [phone]  - Dr. Jackquline: [phone]  - Dr. Claudene: [phone]   In the event of inclement weather, please call our main line at [phone] for an update on the status of any delays or closures.  Dermatology Medication Tips: Please keep the boxes that topical medications come in in order to help keep track of the instructions about where and how to use these. Pharmacies typically print the medication instructions only on the boxes and not directly on the medication tubes.   If your medication is too expensive, please contact our office at [phone] option 4 or send us  a message through MyChart.   We are unable to tell what your co-pay for medications will be in advance as this is different depending on your insurance coverage. However, we may be able to find a substitute medication at lower cost or fill out paperwork to get insurance to cover a needed medication.   If a prior authorization is required to get your medication covered  by your insurance company, please allow us  1-2 business days to complete this process.  Drug prices often vary depending on where the prescription is filled and some pharmacies may offer cheaper prices.  The website www.goodrx.com contains coupons for medications through different pharmacies. The prices here do not account for what the cost may be with help from insurance (it may be cheaper with your insurance), but the website can give you the price if you did not use any insurance.  - You can print the associated coupon and take it with your prescription to the pharmacy.  - You may also stop by our office during regular business hours and pick up a GoodRx coupon card.  - If you need your prescription sent electronically to a different pharmacy, notify our office through [facility] or by phone at [phone] option 4.     Si Usted Necesita Algo Despus de Su Visita  Tambin puede enviarnos un mensaje a travs de MyChart. Por lo general respondemos a los mensajes de MyChart en el transcurso de 1 a 2 das hbiles.  Para renovar recetas, por favor pida a su farmacia que se ponga en contacto con nuestra oficina. Randi lakes de fax es [place] [phone].  Si tiene un asunto urgente cuando la clnica est cerrada y que no puede esperar hasta el siguiente da hbil, puede llamar/localizar a su doctor(a) al nmero que aparece a continuacin.   Por favor, tenga en cuenta que aunque hacemos todo lo posible para estar disponibles para asuntos urgentes fuera del horario de [place], no estamos disponibles las 24 horas del da, los 7 [address]  [address] de la [place].   Si tiene un problema urgente y no puede comunicarse con nosotros, puede optar por buscar atencin mdica  en el consultorio de su doctor(a), en una clnica privada, en un centro de atencin urgente o en una sala de emergencias.  Si tiene Engineer, drilling, por favor llame inmediatamente al 911 o vaya a la sala de emergencias.  Nmeros de  bper  - Dr. Hester: [phone]  - Dra. Jackquline: [phone]  - Dr. Claudene: [phone]   En caso de inclemencias del tiempo, por favor llame a landry capes principal al [phone] para una actualizacin sobre el [place] de cualquier retraso o cierre.  Consejos para la medicacin en dermatologa: Por favor, guarde las cajas en las que vienen los medicamentos de uso tpico para ayudarle a seguir las instrucciones sobre dnde y cmo usarlos. Las farmacias generalmente imprimen las instrucciones del medicamento slo en las cajas y no directamente en los tubos del [place].   Si su medicamento es muy caro, por favor, pngase en contacto con landry rieger llamando al [phone] y presione la opcin 4 o envenos un mensaje a travs de Clinical cytogeneticist.   No podemos decirle cul ser su copago por los medicamentos por adelantado ya que esto es diferente dependiendo de la cobertura de su seguro. Sin embargo, es posible que podamos encontrar un medicamento sustituto a Audiological scientist un formulario para que el seguro cubra el medicamento que se considera necesario.   Si se requiere una autorizacin previa para que su compaa de seguros malta su medicamento, por favor permtanos de 1 a 2 das hbiles para completar este proceso.  Los precios de los medicamentos varan con frecuencia dependiendo del Environmental consultant de dnde se surte la receta y alguna farmacias pueden ofrecer precios ms baratos.  El sitio web www.goodrx.com tiene cupones para medicamentos de Health and safety inspector. Los precios aqu no tienen en cuenta lo que podra costar con la ayuda del seguro (puede ser ms barato con su seguro), pero el sitio web puede darle el precio si no utiliz Tourist information centre manager.  - Puede imprimir el cupn correspondiente y llevarlo con su receta a la farmacia.  - Tambin puede pasar por nuestra oficina durante el horario de atencin regular y Education officer, museum una tarjeta de cupones de GoodRx.  - Si necesita que su receta se  enve electrnicamente a una farmacia diferente, informe a nuestra oficina a travs de MyChart de [place] o por telfono llamando al [phone] y presione la opcin 4.

## 2024-02-11 ENCOUNTER — Ambulatory Visit: Payer: Self-pay | Admitting: Dermatology

## 2024-02-11 LAB — SURGICAL PATHOLOGY

## 2024-02-11 NOTE — Telephone Encounter (Signed)
 Discussed pathology results and treatment plan. Patient will discuss with his wife and C/B with Mohs location of his choice. Patient prefers to schedule 6 month follow up when he calls back with Mohs location.

## 2024-02-11 NOTE — Telephone Encounter (Signed)
-----   Message from [place] sent at 02/11/2024  3:43 PM EDT ----- Diagnosis: R zygoma :       BASAL CELL CARCINOMA, NODULAR PATTERN   Please call with diagnosis and determine where the patient would like to have Mohs surgery.  Explanation: your biopsy shows a basal cell skin cancer in the second layer of the skin. This is the most common kind of skin cancer and is caused by damage from sun exposure. Basal cell skin cancers  almost never spread beyond the skin, so they are not dangerous to your overall health. However, they will continue to grow, can bleed, cause nonhealing wounds, and disrupt nearby structures unless  fully treated.  Treatment: Given the location and type of skin cancer, I recommend Mohs surgery. Mohs surgery involves cutting out the skin cancer and then checking under the microscope to ensure the whole skin  cancer was removed. If any skin cancer remains, the surgeon will cut out more until it is fully removed. The cure rate is about 98-99%. Once the Mohs surgeon confirms the skin cancer is out, they  will discuss the options to repair or heal the area. You must take it easy for about two weeks after surgery (no lifting over 10-15 lbs, avoid activity to get your heart rate and blood pressure up).  It is done at another office outside of [place] ([place], [place], or [place]). ----- Message ----- From: Interface, Lab In [place] Sent: 02/11/2024   2:08 PM EDT To: [name], MD

## 2024-02-12 ENCOUNTER — Other Ambulatory Visit: Payer: Self-pay

## 2024-02-12 DIAGNOSIS — C44319 Basal cell carcinoma of skin of other parts of face: Secondary | ICD-10-CM

## 2024-03-04 ENCOUNTER — Encounter: Payer: Self-pay | Admitting: Dermatology

## 2024-03-10 ENCOUNTER — Encounter: Payer: Self-pay | Admitting: Dermatology

## 2024-03-10 ENCOUNTER — Ambulatory Visit (INDEPENDENT_AMBULATORY_CARE_PROVIDER_SITE_OTHER): Admitting: Dermatology

## 2024-03-10 VITALS — BP 159/79 | HR 72 | Temp 98.8°F

## 2024-03-10 DIAGNOSIS — C44310 Basal cell carcinoma of skin of unspecified parts of face: Secondary | ICD-10-CM | POA: Diagnosis not present

## 2024-03-10 DIAGNOSIS — C4491 Basal cell carcinoma of skin, unspecified: Secondary | ICD-10-CM

## 2024-03-10 NOTE — Progress Notes (Signed)
 Follow-Up Visit   Subjective  Gregory Kayce Betty. is a 68 y.o. male who presents for the following: Mohs of a Nodular Basal Cell Carcinoma on the right zygoma, biopsied by Dr. Claudene.   The following portions of the chart were reviewed this encounter and updated as appropriate: medications, allergies, medical history  Review of Systems:  No other skin or systemic complaints except as noted in HPI or Assessment and Plan.  Objective  Well appearing patient in no apparent distress; mood and affect are within normal limits.  A focused examination was performed of the following areas: Right zygoma Relevant physical exam findings are noted in the Assessment and Plan.   Right Zygoma Pink pearly papule or plaque with arborizing vessels.    Assessment & Plan   BASAL CELL CARCINOMA (BCC), UNSPECIFIED SITE Right Zygoma Mohs surgery  Consent obtained: written  Anticoagulation: Is the patient taking prescription anticoagulant and/or aspirin prescribed/recommended by a physician? Yes   Was the anticoagulation regimen changed prior to Mohs? No    Procedure Details: Timeout: pre-procedure verification complete Procedure Prep: patient was prepped and draped in usual sterile fashion Prep type: chlorhexidine Biopsy accession number: [id number] Biopsy lab: [facility] Frozen section biopsy performed: No   Pre-Op diagnosis: basal cell carcinoma BCC subtype: nodular MohsAIQ Surgical site (if tumor spans multiple areas, please select predominant area): cheek (including jawline) Surgery side: right Surgical site (from skin exam): Right Zygoma Pre-operative length (cm): 0.6 Pre-operative width (cm): 0.5 Indications for Mohs surgery: anatomic location where tissue conservation is critical Previously treated? No    Micrographic Surgery Details: Post-operative length (cm): 1 Post-operative width (cm): 0.9 Number of Mohs stages: 1 Cumulative additional sections past 5 per stage: 0 Post  surgery depth of defect: subcutaneous fat Is this a complex case (associate members only): No    Stage 1    Tumor features identified on Mohs section: no tumor identified    Depth of tumor invasion after stage: subcutaneous fat    Perineural invasion: no perineural invasion  Patient tolerance of procedure: tolerated well, no immediate complications  Reconstruction: Was the defect reconstructed? Yes   Was reconstruction performed by the same Mohs surgeon? Yes   Setting of reconstruction: outpatient office When was reconstruction performed? same day Type of reconstruction: linear Linear reconstruction: complex Length of linear repair (cm): 3  Opioids: Did the patient receive a prescription for opioid/narcotic related to Mohs surgery?: No    Antibiotics: Does patient meet AHA guidelines for endocarditis?: No   Does patient meet AHA guidelines for orthopedic prophylaxis?: No   Were antibiotics given on the day of surgery?: No   When were antibiotics given? post-operative Did surgery breach mucosa, expose cartilage/bone, involve an area of lymphedema/inflamed/infected tissue? No    Skin repair Complexity:  Complex Final length (cm):  3.2 Informed consent: discussed and consent obtained   Timeout: patient name, date of birth, surgical site, and procedure verified   Procedure prep:  Patient was prepped and draped in usual sterile fashion Prep type:  Chlorhexidine Anesthesia: the lesion was anesthetized in a standard fashion   Anesthetic:  1% lidocaine  w/ epinephrine 1-100,000 buffered w/ 8.4% NaHCO3 Reason for type of repair: reduce tension to allow closure and preserve normal anatomy   Undermining: edges undermined   Subcutaneous layers (deep stitches):  Suture size:  5-0 Suture type: Monocryl (poliglecaprone 25)   Stitches:  Buried vertical mattress Fine/surface layer approximation (top stitches):  Suture size:  6-0 Suture type: fast-absorbing plain gut  Stitches: simple  running   Hemostasis achieved with: suture, pressure and electrodesiccation Outcome: patient tolerated procedure well with no complications   Post-procedure details: sterile dressing applied and wound care instructions given   Dressing type: bandage, petrolatum and pressure dressing      No follow-ups on file.  [name], Surg Tech III, am acting as scribe for RUFUS CHRISTELLA HOLY, MD.    03/10/2024  HISTORY OF PRESENT ILLNESS  Gregory Gaylen Venning. is seen in consultation at the request of Dr. Claudene for biopsy-proven Nodular Basal Cell Carcinoma on the right zygoma. They note that the area has been present for about 6 months increasing in size with time.  There is no history of previous treatment.  Reports no other new or changing lesions and has no other complaints today.  Medications and allergies: see patient chart.  Review of systems: Reviewed 8 systems and notable for the above skin cancer.  All other systems reviewed are unremarkable/negative, unless noted in the HPI. Past medical history, surgical history, family history, social history were also reviewed and are noted in the chart/questionnaire.    PHYSICAL EXAMINATION  General: Well-appearing, in no acute distress, alert and oriented x 4. Vitals reviewed in chart (if available).   Skin: Exam reveals a 0.6 x 0.5 cm erythematous papule and biopsy scar on the right zygoma. There are rhytids, telangiectasias, and lentigines, consistent with photodamage.   Biopsy report(s) reviewed, confirming the diagnosis.   ASSESSMENT  1) Nodular Basal Cell Carcinoma on the right zygoma 2) photodamage 3) solar lentigines   PLAN   1. Due to location, size, histology, or recurrence and the likelihood of subclinical extension as well as the need to conserve normal surrounding tissue, the patient was deemed acceptable for Mohs micrographic surgery (MMS).  The nature and purpose of the procedure, associated benefits and risks including recurrence  and scarring, possible complications such as pain, infection, and bleeding, and alternative methods of treatment if appropriate were discussed with the patient during consent. The lesion location was verified by the patient, by reviewing previous notes, pathology reports, and by photographs as well as angulation measurements if available.  Informed consent was reviewed and signed by the patient, and timeout was performed at 10:00 AM. See op note below.  2. For the photodamage and solar lentigines, sun protection discussed/information given on OTC sunscreens, and we recommend continued regular follow-up with primary dermatologist every 6 months or sooner for any growing, bleeding, or changing lesions. 3. Prognosis and future surveillance discussed. 4. Letter with treatment outcome sent to referring provider. 5. Pain acetaminophen/ibuprofen  MOHS MICROGRAPHIC SURGERY AND RECONSTRUCTION  Initial size:   0.6 x 0.5 cm Surgical defect/wound size: 1.0 x 0.9 cm Anesthesia:    0.33% lidocaine  with 1:200,000 epinephrine EBL:    <5 mL Complications:  None Repair type:   Complex SQ suture:   5-0 Monocryl Cutaneous suture:  6-0 Plain gut Final size of the repair: 3.2 cm  Stages: 1  STAGE I: Anesthesia achieved with 0.5% lidocaine  with 1:200,000 epinephrine. ChloraPrep applied. 1 section(s) excised using Mohs technique (this includes total peripheral and deep tissue margin excision and evaluation with frozen sections, excised and interpreted by the same physician). The tumor was first debulked and then excised with an approx. 2mm margin.  Hemostasis was achieved with electrocautery as needed.  The specimen was then oriented, subdivided/relaxed, inked, and processed using Mohs technique.    Frozen section analysis revealed a clear deep and peripheral margin.  Reconstruction  The surgical wound was then cleaned, prepped, and re-anesthetized as above. Wound edges were undermined extensively along at least  one entire edge and at a distance equal to or greater than the width of the defect (see wound defect size above) in order to achieve closure and decrease wound tension and anatomic distortion. Redundant tissue repair including standing cone removal was performed. Hemostasis was achieved with electrocautery. Subcutaneous and epidermal tissues were approximated with the above sutures. The surgical site was then lightly scrubbed with sterile, saline-soaked gauze. The area was then bandaged using Vaseline ointment, non-adherent gauze, gauze pads, and tape to provide an adequate pressure dressing. The patient tolerated the procedure well, was given detailed written and verbal wound care instructions, and was discharged in good condition.   The patient will follow-up: 4 weeks.   Documentation: I have reviewed the above documentation for accuracy and completeness, and I agree with the above.  RUFUS CHRISTELLA HOLY, MD

## 2024-03-10 NOTE — Patient Instructions (Signed)

## 2024-03-18 ENCOUNTER — Encounter: Payer: Self-pay | Admitting: Dermatology

## 2024-04-07 ENCOUNTER — Ambulatory Visit (INDEPENDENT_AMBULATORY_CARE_PROVIDER_SITE_OTHER): Admitting: Dermatology

## 2024-04-07 ENCOUNTER — Encounter: Payer: Self-pay | Admitting: Dermatology

## 2024-04-07 VITALS — BP 144/86

## 2024-04-07 DIAGNOSIS — C4491 Basal cell carcinoma of skin, unspecified: Secondary | ICD-10-CM

## 2024-04-07 DIAGNOSIS — Z85828 Personal history of other malignant neoplasm of skin: Secondary | ICD-10-CM | POA: Diagnosis not present

## 2024-04-07 DIAGNOSIS — L905 Scar conditions and fibrosis of skin: Secondary | ICD-10-CM | POA: Diagnosis not present

## 2024-04-07 NOTE — Patient Instructions (Signed)

## 2024-04-07 NOTE — Progress Notes (Signed)
   Follow Up Visit   Subjective  [name]. is a 68 y.o. male who presents for the following: follow up from Mohs surgery   The patient presents for follow up from Mohs surgery for a BCC on the right zygoma, treated on 03/10/2024, repaired with a complex linear closure. The patient has been bandaging the wound as directed. The endorse the following concerns: none.   The following portions of the chart were reviewed this encounter and updated as appropriate: medications, allergies, medical history  Review of Systems:  No other skin or systemic complaints except as noted in HPI or Assessment and Plan.  Objective  Well appearing patient in no apparent distress; mood and affect are within normal limits.  A focal examination was performed including the face. All findings within normal limits unless otherwise noted below.  Healing wound with mild erythema  Relevant physical exam findings are noted in the Assessment and Plan.    Assessment & Plan    Scar s/p Mohs for [facility] on the right zygoma, treated on 03/10/2024, repaired with a complex linear closure.  - Reassured that wound is healing well - No evidence of infection - No swelling, induration, purulence, dehiscence, or tenderness out of proportion to the clinical exam, see photo above - Discussed that scars take up to 12 months to mature from the date of surgery - Recommend SPF 30+ to scar daily to prevent purple color from UV exposure during scar maturation process - Discussed that erythema and raised appearance of scar will fade over the next 4-6 months - OK to start scar massage at 4-6 weeks post-op - Can consider silicone based products for scar healing starting at 6 weeks post-op - Ok to discontinue ointment daily  HISTORY OF BASAL CELL CARCINOMA OF THE SKIN - No evidence of recurrence today - Recommend regular full body skin exams - Recommend daily broad spectrum sunscreen SPF 30+ to sun-exposed areas, reapply every 2  hours as needed.  - Call if any new or changing lesions are noted between office visits    Return if symptoms worsen or fail to improve.  LILLETTE Rollene Gobble, RN, am acting as scribe for RUFUS CHRISTELLA HOLY, MD .   Documentation: I have reviewed the above documentation for accuracy and completeness, and I agree with the above.  RUFUS CHRISTELLA HOLY, MD

## 2025-02-05 ENCOUNTER — Ambulatory Visit: Admitting: Dermatology
